# Patient Record
Sex: Male | Born: 1955 | Race: White | Hispanic: No | Marital: Married | Smoking: Never smoker
Health system: Southern US, Community
[De-identification: ages and names within clinical notes are randomized; demographics above are authoritative.]

## PROBLEM LIST (undated history)

## (undated) DIAGNOSIS — K219 Gastro-esophageal reflux disease without esophagitis: Secondary | ICD-10-CM

## (undated) DIAGNOSIS — J45909 Unspecified asthma, uncomplicated: Secondary | ICD-10-CM

## (undated) DIAGNOSIS — T7840XA Allergy, unspecified, initial encounter: Secondary | ICD-10-CM

## (undated) HISTORY — DX: Gastro-esophageal reflux disease without esophagitis: K21.9

## (undated) HISTORY — DX: Allergy, unspecified, initial encounter: T78.40XA

## (undated) HISTORY — DX: Unspecified asthma, uncomplicated: J45.909

---
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
## 1989-01-20 HISTORY — PX: CHOLECYSTECTOMY: SHX55

## 2000-03-05 ENCOUNTER — Encounter: Payer: Self-pay | Admitting: *Deleted

## 2000-03-05 ENCOUNTER — Emergency Department (HOSPITAL_COMMUNITY): Admission: EM | Admit: 2000-03-05 | Discharge: 2000-03-05 | Payer: Self-pay | Admitting: *Deleted

## 2000-03-06 ENCOUNTER — Emergency Department (HOSPITAL_COMMUNITY): Admission: EM | Admit: 2000-03-06 | Discharge: 2000-03-06 | Payer: Self-pay | Admitting: *Deleted

## 2000-03-16 ENCOUNTER — Encounter: Payer: Self-pay | Admitting: Orthopedic Surgery

## 2000-03-17 ENCOUNTER — Encounter: Payer: Self-pay | Admitting: Orthopedic Surgery

## 2000-03-17 ENCOUNTER — Ambulatory Visit (HOSPITAL_COMMUNITY): Admission: RE | Admit: 2000-03-17 | Discharge: 2000-03-18 | Payer: Self-pay | Admitting: Orthopedic Surgery

## 2000-05-24 ENCOUNTER — Encounter: Payer: Self-pay | Admitting: Orthopedic Surgery

## 2000-05-24 ENCOUNTER — Ambulatory Visit (HOSPITAL_COMMUNITY): Admission: RE | Admit: 2000-05-24 | Discharge: 2000-05-25 | Payer: Self-pay | Admitting: Orthopedic Surgery

## 2000-08-17 ENCOUNTER — Encounter: Payer: Self-pay | Admitting: Orthopedic Surgery

## 2000-08-17 ENCOUNTER — Ambulatory Visit (HOSPITAL_COMMUNITY): Admission: RE | Admit: 2000-08-17 | Discharge: 2000-08-17 | Payer: Self-pay | Admitting: Orthopedic Surgery

## 2001-04-05 ENCOUNTER — Ambulatory Visit (HOSPITAL_COMMUNITY): Admission: RE | Admit: 2001-04-05 | Discharge: 2001-04-05 | Payer: Self-pay | Admitting: Orthopedic Surgery

## 2001-04-05 ENCOUNTER — Encounter: Payer: Self-pay | Admitting: Orthopedic Surgery

## 2001-12-20 HISTORY — PX: OTHER SURGICAL HISTORY: SHX169

## 2004-10-06 ENCOUNTER — Encounter: Admission: RE | Admit: 2004-10-06 | Discharge: 2004-10-06 | Payer: Self-pay | Admitting: Neurology

## 2004-10-12 ENCOUNTER — Encounter: Admission: RE | Admit: 2004-10-12 | Discharge: 2004-10-12 | Payer: Self-pay | Admitting: Neurology

## 2004-12-24 ENCOUNTER — Ambulatory Visit: Payer: Self-pay | Admitting: Pulmonary Disease

## 2005-07-14 ENCOUNTER — Ambulatory Visit: Payer: Self-pay | Admitting: Pulmonary Disease

## 2007-07-26 ENCOUNTER — Ambulatory Visit: Payer: Self-pay | Admitting: Pulmonary Disease

## 2007-07-26 LAB — CONVERTED CEMR LAB
ALT: 17 units/L (ref 0–53)
AST: 19 units/L (ref 0–37)
Albumin: 4.3 g/dL (ref 3.5–5.2)
Alkaline Phosphatase: 73 units/L (ref 39–117)
BUN: 14 mg/dL (ref 6–23)
Basophils Absolute: 0 10*3/uL (ref 0.0–0.1)
Basophils Relative: 0.5 % (ref 0.0–1.0)
Bilirubin, Direct: 0.1 mg/dL (ref 0.0–0.3)
CO2: 29 meq/L (ref 19–32)
Calcium: 9.3 mg/dL (ref 8.4–10.5)
Chloride: 105 meq/L (ref 96–112)
Cholesterol: 209 mg/dL (ref 0–200)
Creatinine, Ser: 0.8 mg/dL (ref 0.4–1.5)
Direct LDL: 133.8 mg/dL
Eosinophils Absolute: 0.2 10*3/uL (ref 0.0–0.6)
Eosinophils Relative: 3.4 % (ref 0.0–5.0)
GFR calc Af Amer: 132 mL/min
GFR calc non Af Amer: 109 mL/min
Glucose, Bld: 106 mg/dL — ABNORMAL HIGH (ref 70–99)
HCT: 44.4 % (ref 39.0–52.0)
HDL: 54.2 mg/dL (ref >39.0)
Hemoglobin: 15.5 g/dL (ref 13.0–17.0)
Lymphocytes Relative: 22.3 % (ref 12.0–46.0)
MCHC: 35 g/dL (ref 30.0–36.0)
MCV: 92.4 fL (ref 78.0–100.0)
Monocytes Absolute: 0.4 10*3/uL (ref 0.2–0.7)
Monocytes Relative: 9.1 % (ref 3.0–11.0)
Neutro Abs: 3.1 10*3/uL (ref 1.4–7.7)
Neutrophils Relative %: 64.7 % (ref 43.0–77.0)
PSA: 0.93 ng/mL (ref 0.10–4.00)
Platelets: 196 10*3/uL (ref 150–400)
Potassium: 4.4 meq/L (ref 3.5–5.1)
RBC: 4.8 M/uL (ref 4.22–5.81)
RDW: 11.6 % (ref 11.5–14.6)
Sodium: 139 meq/L (ref 135–145)
TSH: 2.08 microintl units/mL (ref 0.35–5.50)
Total Bilirubin: 2.1 mg/dL — ABNORMAL HIGH (ref 0.3–1.2)
Total CHOL/HDL Ratio: 3.9
Total Protein: 7.1 g/dL (ref 6.0–8.3)
Triglycerides: 47 mg/dL (ref 0–149)
VLDL: 9 mg/dL (ref 0–40)
WBC: 4.7 10*3/uL (ref 4.5–10.5)

## 2008-02-17 ENCOUNTER — Emergency Department (HOSPITAL_COMMUNITY): Admission: EM | Admit: 2008-02-17 | Discharge: 2008-02-17 | Payer: Self-pay | Admitting: Family Medicine

## 2008-11-15 ENCOUNTER — Emergency Department (HOSPITAL_COMMUNITY): Admission: EM | Admit: 2008-11-15 | Discharge: 2008-11-15 | Payer: Self-pay | Admitting: Emergency Medicine

## 2008-12-19 ENCOUNTER — Ambulatory Visit: Payer: Self-pay | Admitting: Pulmonary Disease

## 2008-12-19 DIAGNOSIS — J45909 Unspecified asthma, uncomplicated: Secondary | ICD-10-CM

## 2008-12-19 DIAGNOSIS — Z9189 Other specified personal risk factors, not elsewhere classified: Secondary | ICD-10-CM | POA: Insufficient documentation

## 2008-12-19 DIAGNOSIS — E7800 Pure hypercholesterolemia, unspecified: Secondary | ICD-10-CM | POA: Insufficient documentation

## 2008-12-19 DIAGNOSIS — Z87438 Personal history of other diseases of male genital organs: Secondary | ICD-10-CM

## 2008-12-19 DIAGNOSIS — R209 Unspecified disturbances of skin sensation: Secondary | ICD-10-CM | POA: Insufficient documentation

## 2008-12-19 DIAGNOSIS — J309 Allergic rhinitis, unspecified: Secondary | ICD-10-CM

## 2008-12-19 LAB — CONVERTED CEMR LAB
ALT: 17 units/L (ref 0–53)
AST: 19 units/L (ref 0–37)
Albumin: 4.3 g/dL (ref 3.5–5.2)
Alkaline Phosphatase: 72 units/L (ref 39–117)
BUN: 20 mg/dL (ref 6–23)
Bacteria, UA: NEGATIVE
Basophils Absolute: 0 10*3/uL (ref 0.0–0.1)
Basophils Relative: 0.8 % (ref 0.0–3.0)
Bilirubin Urine: NEGATIVE
Bilirubin, Direct: 0.2 mg/dL (ref 0.0–0.3)
CO2: 31 meq/L (ref 19–32)
Calcium: 9.6 mg/dL (ref 8.4–10.5)
Chloride: 105 meq/L (ref 96–112)
Cholesterol: 207 mg/dL (ref 0–200)
Creatinine, Ser: 0.8 mg/dL (ref 0.4–1.5)
Crystals: NEGATIVE
Direct LDL: 128 mg/dL
Eosinophils Absolute: 0.1 10*3/uL (ref 0.0–0.7)
Eosinophils Relative: 3.1 % (ref 0.0–5.0)
GFR calc Af Amer: 131 mL/min
GFR calc non Af Amer: 108 mL/min
Glucose, Bld: 100 mg/dL — ABNORMAL HIGH (ref 70–99)
HCT: 44.7 % (ref 39.0–52.0)
HDL: 60.3 mg/dL (ref >39.0)
Hemoglobin, Urine: NEGATIVE
Hemoglobin: 16.1 g/dL (ref 13.0–17.0)
Ketones, ur: NEGATIVE mg/dL
Leukocytes, UA: NEGATIVE
Lymphocytes Relative: 24.4 % (ref 12.0–46.0)
MCHC: 36.1 g/dL — ABNORMAL HIGH (ref 30.0–36.0)
MCV: 91 fL (ref 78.0–100.0)
Monocytes Absolute: 0.4 10*3/uL (ref 0.1–1.0)
Monocytes Relative: 8.6 % (ref 3.0–12.0)
Neutro Abs: 2.9 10*3/uL (ref 1.4–7.7)
Neutrophils Relative %: 63.1 % (ref 43.0–77.0)
Nitrite: NEGATIVE
PSA: 1.09 ng/mL (ref 0.10–4.00)
Platelets: 171 10*3/uL (ref 150–400)
Potassium: 4.7 meq/L (ref 3.5–5.1)
RBC / HPF: NONE SEEN
RBC: 4.91 M/uL (ref 4.22–5.81)
RDW: 11.7 % (ref 11.5–14.6)
Sodium: 142 meq/L (ref 135–145)
Specific Gravity, Urine: 1.02 (ref 1.000–1.03)
TSH: 1.58 microintl units/mL (ref 0.35–5.50)
Total Bilirubin: 1.4 mg/dL — ABNORMAL HIGH (ref 0.3–1.2)
Total CHOL/HDL Ratio: 3.4
Total Protein, Urine: NEGATIVE mg/dL
Total Protein: 7 g/dL (ref 6.0–8.3)
Triglycerides: 46 mg/dL (ref 0–149)
Urine Glucose: NEGATIVE mg/dL
Urobilinogen, UA: 0.2 (ref 0.0–1.0)
VLDL: 9 mg/dL (ref 0–40)
WBC, UA: NONE SEEN cells/hpf
WBC: 4.5 10*3/uL (ref 4.5–10.5)
pH: 6 (ref 5.0–8.0)

## 2008-12-31 ENCOUNTER — Ambulatory Visit: Payer: Self-pay | Admitting: Gastroenterology

## 2009-01-15 ENCOUNTER — Ambulatory Visit: Payer: Self-pay | Admitting: Gastroenterology

## 2010-05-22 ENCOUNTER — Telehealth: Payer: Self-pay | Admitting: Pulmonary Disease

## 2010-05-27 ENCOUNTER — Ambulatory Visit: Payer: Self-pay | Admitting: Pulmonary Disease

## 2010-05-29 LAB — CONVERTED CEMR LAB
ALT: 17 units/L (ref 0–53)
AST: 19 units/L (ref 0–37)
Albumin: 4.5 g/dL (ref 3.5–5.2)
Alkaline Phosphatase: 72 units/L (ref 39–117)
Anti Nuclear Antibody(ANA): NEGATIVE
BUN: 20 mg/dL (ref 6–23)
Basophils Absolute: 0 10*3/uL (ref 0.0–0.1)
Basophils Relative: 0.1 % (ref 0.0–3.0)
Bilirubin, Direct: 0.2 mg/dL (ref 0.0–0.3)
CO2: 31 meq/L (ref 19–32)
CRP, High Sensitivity: 0.86 (ref 0.00–5.00)
Calcium: 9.1 mg/dL (ref 8.4–10.5)
Chloride: 104 meq/L (ref 96–112)
Creatinine, Ser: 0.7 mg/dL (ref 0.4–1.5)
Eosinophils Absolute: 0.2 10*3/uL (ref 0.0–0.7)
Eosinophils Relative: 2.9 % (ref 0.0–5.0)
GFR calc non Af Amer: 129.87 mL/min (ref >60)
Glucose, Bld: 85 mg/dL (ref 70–99)
HCT: 43.5 % (ref 39.0–52.0)
Hemoglobin: 15.6 g/dL (ref 13.0–17.0)
Lymphocytes Relative: 21.1 % (ref 12.0–46.0)
Lymphs Abs: 1.4 10*3/uL (ref 0.7–4.0)
MCHC: 35.8 g/dL (ref 30.0–36.0)
MCV: 92.9 fL (ref 78.0–100.0)
Monocytes Absolute: 0.5 10*3/uL (ref 0.1–1.0)
Monocytes Relative: 7.5 % (ref 3.0–12.0)
Neutro Abs: 4.7 10*3/uL (ref 1.4–7.7)
Neutrophils Relative %: 68.4 % (ref 43.0–77.0)
Platelets: 180 10*3/uL (ref 150.0–400.0)
Potassium: 4 meq/L (ref 3.5–5.1)
RBC: 4.68 M/uL (ref 4.22–5.81)
RDW: 12.8 % (ref 11.5–14.6)
Rheumatoid fact SerPl-aCnc: 32.6 intl units/mL — ABNORMAL HIGH (ref 0.0–20.0)
Sed Rate: 8 mm/hr (ref 0–22)
Sodium: 142 meq/L (ref 135–145)
TSH: 1.14 microintl units/mL (ref 0.35–5.50)
Total Bilirubin: 1.6 mg/dL — ABNORMAL HIGH (ref 0.3–1.2)
Total Protein: 6.9 g/dL (ref 6.0–8.3)
WBC: 6.8 10*3/uL (ref 4.5–10.5)

## 2011-01-19 NOTE — Progress Notes (Signed)
Summary: swelling  Phone Note Call from Patient Call back at Home Phone [phone] Call back at Work Phone [phone] Call back at [phone] office or cell   Caller: Patient Call For: Kyzer Blowe Reason for Call: Talk to Nurse Summary of Call: pt having trouble with hands and feet swelling( retaining water badly). x1 week.  Migraines are increasing.  Almost out of meds for his migraines.  Would like to see SN.  Hasn't seen him since 12/09.  Can we get him worked in?  Call office or cell 1st. Initial call taken by: Eugene Gavia,  May 22, 2010 1:25 PM  Follow-up for Phone Call        Spoke with pt.  He c/o swelling in hands and ankles and feet x 1 wk.  He also states that he has been having migraines alot more frequesntly over the past several months.  Almost out of midrin.  Would like ov with SN.  Last seen Dec 2009.  Please advise, thanks Follow-up by: Vernie Murders,  May 22, 2010 2:28 PM  Additional Follow-up for Phone Call Additional follow up Details #1::        per SN---ok to come in on wednesday at 3:30---he has enough midrin to last until the appt---he has been watching his salt intake but will cont to do so -- i told him that if he needs [place] prior to the appt to give [place] a call.  pt voiced his understanding and is aware of appt date. Randell Loop CMA  May 22, 2010 2:55 PM
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
## 2011-05-07 NOTE — Op Note (Signed)
[place]  Patient:    Austin Hubbard, Austin Hubbard                       MRN: [id number] Proc. Date: 05/24/00 Adm. Date:  [id number] Disc. Date: [id number] Attending:  Burnard Bunting                           Operative Report  PREOPERATIVE DIAGNOSIS:  Retained hardware right clavicle following open reduction/internal fixation.  POSTOPERATIVE DIAGNOSIS:  Delayed union of right clavicle fracture.  PROCEDURE:  Right clavicle attempted removal of right clavicle pin with identification of delayed union and takedown of fibrous tissue from within the clavicular fracture site.  Bone grafting and replacement of clavicular pin.  SURGEON:  Graylin Shiver. August Saucer, M.D.  ANESTHESIA:  General endotracheal.  ESTIMATED BLOOD LOSS:  10 cc.  INDICATIONS:  Austin Hubbard is a 55 year old patient, who is now 9-1/2 weeks status post open reduction/internal fixation of displaced right clavicle shaft fracture with subsequent mild brachial plexopathy.  The patient achieved full painless range of motion of his shoulder and had ______ formation evident on plain x-rays prior to todays hardware removal.  PROCEDURE IN DETAIL:  Patient was brought to the operating room, where general endotracheal anesthesia was induced.  Preoperative IV antibiotics were administered.  Right arm and shoulder girdle were prepped with Betadine and Duraprep solution and draped in a sterile manner.  Ioban sheet was used to cover the operative field.  Prior incision for prior posterior incision for screw placement was incised.  Bursa had developed around the pin site.  This bursal fluid and sac were evacuated.  The T-handle wrench was then placed on the clavicular screw, which was then backed out to the fracture site.  Under fluoroscopic guidance, the fracture was noted to have hinged motion.  Across the distal end was noted to subluxate about 2-3 mm posteriorly after removal of the pin from across the fracture  site.  Using the pin as a lever, motion at the fracture site was again demonstrated.  At this time, an decision was made to proceed with takedown of the delayed union and bone grafting.  Prior incision over the mid portion of the clavicle was utilized.  Skin and subcutaneous tissue were sharply divided.  The fracture site was identified. Under fluoroscopic and direct visualization, the pin was then advanced back into the medial fragment.  Using a curet and Baer rongeurs, fibrous tissue was removed from the bony edges.  A curet was used to remove sclerotic bone from around the edges.  At this time, the incision was thoroughly irrigated.  After reestablishing with minimal soft tissue dissection except around the fracture site and debridement of any tissue between the bone ends and debridement of the sclerotic portions of the bone ends back to bleeding bone, a combination of Grafton and pulverized bone chips were mixed and then placed into the delayed/nonunion site.  The screw was then advanced until the hub of the nut assembly on the posterolateral clavicle was well seated against the lateral clavicle.  Good purchase with the distal screw threads were obtained in the medial fragment.  The clavicle moved as a unit when checked under fluoroscopy. At this time, the fibrous tissue from the bony nonunion site was sent for culture and sensitivity.  There was no evidence of infection at either of the incisions.  Both incisions were then closed using 2-0 Vicryl  to reapproximate the deep subdermal tissue and 3-0 nylon to reapproximate the skin edges.  An impervious dressing was placed over the incisions.  Patient was then placed in a shoulder sling and transferred to the recovery room in stable condition. DD:  05/25/00 TD:  05/27/00 Job: [id number]

## 2011-09-13 LAB — POCT RAPID STREP A: Streptococcus, Group A Screen (Direct): NEGATIVE

## 2011-09-21 LAB — POCT RAPID STREP A: Streptococcus, Group A Screen (Direct): NEGATIVE

## 2014-01-25 ENCOUNTER — Telehealth: Payer: Self-pay | Admitting: Pulmonary Disease

## 2014-01-25 NOTE — Telephone Encounter (Signed)
Called and spoke with pt and he is aware that SN will be retiring from primary care in April.  Pt was given [facility] in [place] or [facility] [place].  Pt is aware of these offices and will call and set up his appt.  Nothing further is needed.

## 2014-01-25 NOTE — Telephone Encounter (Signed)
Pt want recommendation for new primary dr  from  dr/ nadel.Austin GriffinsStanley A Hubbard

## 2014-11-25 ENCOUNTER — Telehealth: Payer: Self-pay | Admitting: Pulmonary Disease

## 2014-11-25 NOTE — Telephone Encounter (Signed)
Pt calling a/b appointment again.Caren GriffinsStanley A Dalton

## 2014-11-25 NOTE — Telephone Encounter (Signed)
Called and spoke to pt. Pt requesting appt with SN for PCP. Pt has not been seen since 2011. Advised pt that SN is no longer taking new PCP pt's . Pt requested Dr. Kriste BasqueNadel recommend a new PCP he should see.   Dr. Kriste BasqueNadel please advise.

## 2014-11-25 NOTE — Telephone Encounter (Signed)
Per SN---  He will be happy to see this pt.  appt has been scheduled for the pt on Wednesday.  Pt is aware and nothing further is needed.

## 2014-11-27 ENCOUNTER — Ambulatory Visit (INDEPENDENT_AMBULATORY_CARE_PROVIDER_SITE_OTHER): Payer: BC Managed Care – PPO | Admitting: Pulmonary Disease

## 2014-11-27 ENCOUNTER — Encounter: Payer: Self-pay | Admitting: Pulmonary Disease

## 2014-11-27 ENCOUNTER — Other Ambulatory Visit (INDEPENDENT_AMBULATORY_CARE_PROVIDER_SITE_OTHER): Payer: BC Managed Care – PPO

## 2014-11-27 ENCOUNTER — Ambulatory Visit (INDEPENDENT_AMBULATORY_CARE_PROVIDER_SITE_OTHER)
Admission: RE | Admit: 2014-11-27 | Discharge: 2014-11-27 | Disposition: A | Discharge status: Home / Self Care | Payer: BC Managed Care – PPO | Source: Ambulatory Visit | Attending: Pulmonary Disease | Admitting: Pulmonary Disease

## 2014-11-27 ENCOUNTER — Encounter (INDEPENDENT_AMBULATORY_CARE_PROVIDER_SITE_OTHER): Payer: Self-pay

## 2014-11-27 VITALS — BP 138/86 | HR 66 | Temp 98.0°F | Ht 67.0 in | Wt 169.2 lb

## 2014-11-27 DIAGNOSIS — Z0000 Encounter for general adult medical examination without abnormal findings: Secondary | ICD-10-CM | POA: Insufficient documentation

## 2014-11-27 DIAGNOSIS — E7800 Pure hypercholesterolemia, unspecified: Secondary | ICD-10-CM

## 2014-11-27 DIAGNOSIS — Z8669 Personal history of other diseases of the nervous system and sense organs: Secondary | ICD-10-CM

## 2014-11-27 DIAGNOSIS — J301 Allergic rhinitis due to pollen: Secondary | ICD-10-CM

## 2014-11-27 DIAGNOSIS — J4520 Mild intermittent asthma, uncomplicated: Secondary | ICD-10-CM

## 2014-11-27 DIAGNOSIS — Z87438 Personal history of other diseases of male genital organs: Secondary | ICD-10-CM

## 2014-11-27 DIAGNOSIS — Z820 Family history of epilepsy and other diseases of the nervous system: Secondary | ICD-10-CM

## 2014-11-27 DIAGNOSIS — R1310 Dysphagia, unspecified: Secondary | ICD-10-CM

## 2014-11-27 LAB — CBC WITH DIFFERENTIAL/PLATELET
Basophils Absolute: 0 10*3/uL (ref 0.0–0.1)
Basophils Relative: 0.5 % (ref 0.0–3.0)
Eosinophils Absolute: 0.1 10*3/uL (ref 0.0–0.7)
Eosinophils Relative: 1.8 % (ref 0.0–5.0)
HCT: 47.3 % (ref 39.0–52.0)
Hemoglobin: 16.3 g/dL (ref 13.0–17.0)
Lymphocytes Relative: 21.5 % (ref 12.0–46.0)
Lymphs Abs: 1.3 10*3/uL (ref 0.7–4.0)
MCHC: 34.4 g/dL (ref 30.0–36.0)
MCV: 92.5 fl (ref 78.0–100.0)
Monocytes Absolute: 0.5 10*3/uL (ref 0.1–1.0)
Monocytes Relative: 7.8 % (ref 3.0–12.0)
Neutro Abs: 4.2 10*3/uL (ref 1.4–7.7)
Neutrophils Relative %: 68.4 % (ref 43.0–77.0)
Platelets: 201 10*3/uL (ref 150.0–400.0)
RBC: 5.11 Mil/uL (ref 4.22–5.81)
RDW: 12.5 % (ref 11.5–15.5)
WBC: 6.1 10*3/uL (ref 4.0–10.5)

## 2014-11-27 LAB — LIPID PANEL
Cholesterol: 211 mg/dL — ABNORMAL HIGH (ref 0–200)
HDL: 53.7 mg/dL (ref >39.00)
LDL Cholesterol: 141 mg/dL — ABNORMAL HIGH (ref 0–99)
NonHDL: 157.3
Total CHOL/HDL Ratio: 4
Triglycerides: 82 mg/dL (ref 0.0–149.0)
VLDL: 16.4 mg/dL (ref 0.0–40.0)

## 2014-11-27 LAB — BASIC METABOLIC PANEL
BUN: 13 mg/dL (ref 6–23)
CO2: 28 mEq/L (ref 19–32)
Calcium: 9.2 mg/dL (ref 8.4–10.5)
Chloride: 102 mEq/L (ref 96–112)
Creatinine, Ser: 0.8 mg/dL (ref 0.4–1.5)
GFR: 112.33 mL/min (ref >60.00)
Glucose, Bld: 93 mg/dL (ref 70–99)
Potassium: 4.3 mEq/L (ref 3.5–5.1)
Sodium: 138 mEq/L (ref 135–145)

## 2014-11-27 LAB — HEPATIC FUNCTION PANEL
ALT: 12 U/L (ref 0–53)
AST: 18 U/L (ref 0–37)
Albumin: 4.4 g/dL (ref 3.5–5.2)
Alkaline Phosphatase: 73 U/L (ref 39–117)
Bilirubin, Direct: 0.2 mg/dL (ref 0.0–0.3)
Total Bilirubin: 1.8 mg/dL — ABNORMAL HIGH (ref 0.2–1.2)
Total Protein: 7.1 g/dL (ref 6.0–8.3)

## 2014-11-27 LAB — PSA: PSA: 1.06 ng/mL (ref 0.10–4.00)

## 2014-11-27 LAB — TSH: TSH: 2.42 u[IU]/mL (ref 0.35–4.50)

## 2014-11-27 MED ORDER — PANTOPRAZOLE SODIUM 40 MG PO TBEC: 40.0000 mg | DELAYED_RELEASE_TABLET | Freq: Every day | ORAL | Status: DC

## 2014-11-27 MED ORDER — ISOMETHEPTENE-APAP-DICHLORAL 65-325-100 MG PO CAPS: 1.0000 | ORAL_CAPSULE | Freq: Four times a day (QID) | ORAL | Status: DC | PRN

## 2014-11-27 NOTE — Patient Instructions (Signed)
Today we updated your med list in our EPIC system...     We wrote new prescriptions for>    MIDRIN to use as needed for headaches...    PROTONIX (Pantoprazole) 40mg - take one tab about 30 min before the evening meal...  Today we did your follow up CXR, EKG, & fasting blood work...    We will contact you w/ the results when available...   wewill sched a Barium Swallow for further eval of your swallowing difficulty...    We will contact you w/ these results when available...   Keep up the good work w/ diet & exercise...  Call for any questions...  Let's plan a follow up visit in [id number], sooner if needed for problems.Marland Kitchen..Marland Kitchen
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
## 2014-11-28 ENCOUNTER — Ambulatory Visit (HOSPITAL_COMMUNITY)
Admission: RE | Admit: 2014-11-28 | Discharge: 2014-11-28 | Disposition: A | Discharge status: Home / Self Care | Payer: BC Managed Care – PPO | Source: Ambulatory Visit | Attending: Pulmonary Disease | Admitting: Pulmonary Disease

## 2014-11-28 DIAGNOSIS — R1310 Dysphagia, unspecified: Secondary | ICD-10-CM | POA: Insufficient documentation

## 2014-11-29 ENCOUNTER — Telehealth: Payer: Self-pay | Admitting: Pulmonary Disease

## 2014-11-29 ENCOUNTER — Ambulatory Visit (INDEPENDENT_AMBULATORY_CARE_PROVIDER_SITE_OTHER): Payer: BC Managed Care – PPO

## 2014-11-29 ENCOUNTER — Other Ambulatory Visit: Payer: Self-pay | Admitting: Pulmonary Disease

## 2014-11-29 ENCOUNTER — Ambulatory Visit: Payer: BC Managed Care – PPO

## 2014-11-29 DIAGNOSIS — Z23 Encounter for immunization: Secondary | ICD-10-CM

## 2014-11-29 DIAGNOSIS — R1310 Dysphagia, unspecified: Secondary | ICD-10-CM

## 2014-11-29 NOTE — Telephone Encounter (Signed)
Shots are taken care of but pt still needs to be scheduled for the other test [phone] or [phone]

## 2014-12-02 ENCOUNTER — Other Ambulatory Visit (HOSPITAL_COMMUNITY): Payer: Self-pay | Admitting: Pulmonary Disease

## 2014-12-02 DIAGNOSIS — R1314 Dysphagia, pharyngoesophageal phase: Secondary | ICD-10-CM

## 2014-12-02 NOTE — Telephone Encounter (Signed)
According to eoic this is set for 12-17-14, is the pt aware? [id number]. Carron CurieJennifer Tyia Binford, CMA

## 2014-12-03 NOTE — Telephone Encounter (Signed)
LMTC x 1 for pt at [phone]

## 2014-12-03 NOTE — Telephone Encounter (Signed)
Spoke with pt and he wants to know that since the first part of his swallowing test was normal does dr Kriste BasqueNadel still want him to start on the Protonix.  Next part of test is on 12/17/14.  Please advise.

## 2014-12-03 NOTE — Telephone Encounter (Signed)
SN has already called and spoke with the pt and pt was advised that he will need to stay on the protonix to decrease the acid related symptoms that could account for the throat/swallowing problem.  But we still need to pursue this further with speech path and poss GI appt pending the results.

## 2014-12-03 NOTE — Telephone Encounter (Signed)
Patient has questions about results of swallow test and medication. [phone]

## 2014-12-17 ENCOUNTER — Ambulatory Visit (HOSPITAL_COMMUNITY)
Admission: RE | Admit: 2014-12-17 | Discharge: 2014-12-17 | Disposition: A | Discharge status: Home / Self Care | Payer: BC Managed Care – PPO | Source: Ambulatory Visit | Attending: Pulmonary Disease | Admitting: Pulmonary Disease

## 2014-12-17 DIAGNOSIS — R1310 Dysphagia, unspecified: Secondary | ICD-10-CM | POA: Diagnosis present

## 2014-12-17 DIAGNOSIS — N419 Inflammatory disease of prostate, unspecified: Secondary | ICD-10-CM | POA: Insufficient documentation

## 2014-12-17 DIAGNOSIS — J45909 Unspecified asthma, uncomplicated: Secondary | ICD-10-CM | POA: Diagnosis not present

## 2014-12-17 DIAGNOSIS — G43909 Migraine, unspecified, not intractable, without status migrainosus: Secondary | ICD-10-CM | POA: Insufficient documentation

## 2014-12-17 DIAGNOSIS — R0789 Other chest pain: Secondary | ICD-10-CM | POA: Insufficient documentation

## 2014-12-17 DIAGNOSIS — E7211 Homocystinuria: Secondary | ICD-10-CM | POA: Diagnosis not present

## 2014-12-17 DIAGNOSIS — R1313 Dysphagia, pharyngeal phase: Secondary | ICD-10-CM | POA: Insufficient documentation

## 2014-12-17 DIAGNOSIS — R1314 Dysphagia, pharyngoesophageal phase: Secondary | ICD-10-CM

## 2014-12-17 DIAGNOSIS — E780 Pure hypercholesterolemia: Secondary | ICD-10-CM | POA: Diagnosis not present
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
## 2015-05-27 ENCOUNTER — Encounter: Payer: Self-pay | Admitting: Gastroenterology

## 2015-06-06 ENCOUNTER — Encounter: Payer: Self-pay | Admitting: Pulmonary Disease

## 2015-06-06 ENCOUNTER — Ambulatory Visit (INDEPENDENT_AMBULATORY_CARE_PROVIDER_SITE_OTHER): Payer: BC Managed Care – PPO | Admitting: Pulmonary Disease

## 2015-06-06 VITALS — BP 130/74 | HR 66 | Temp 98.4°F | Wt 173.8 lb

## 2015-06-06 DIAGNOSIS — E7800 Pure hypercholesterolemia, unspecified: Secondary | ICD-10-CM

## 2015-06-06 DIAGNOSIS — R1310 Dysphagia, unspecified: Secondary | ICD-10-CM | POA: Insufficient documentation

## 2015-06-06 DIAGNOSIS — Z8669 Personal history of other diseases of the nervous system and sense organs: Secondary | ICD-10-CM

## 2015-06-06 DIAGNOSIS — J301 Allergic rhinitis due to pollen: Secondary | ICD-10-CM | POA: Diagnosis not present

## 2015-06-06 DIAGNOSIS — J4520 Mild intermittent asthma, uncomplicated: Secondary | ICD-10-CM
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
## 2015-06-06 NOTE — Patient Instructions (Signed)
Today we updated your med list in our EPIC system...    Continue your current medications the same...  Continue the exercises and precautions as outlined by the speech pathologists...  Call for any questions...  Let's plan a follow up visit in 55mo, sooner if needed for problems.Marland KitchenMarland Kitchen

## 2015-11-28 ENCOUNTER — Ambulatory Visit: Payer: BC Managed Care – PPO | Admitting: Pulmonary Disease

## 2015-12-11 ENCOUNTER — Ambulatory Visit (INDEPENDENT_AMBULATORY_CARE_PROVIDER_SITE_OTHER)
Admission: RE | Admit: 2015-12-11 | Discharge: 2015-12-11 | Disposition: A | Discharge status: Home / Self Care | Payer: BC Managed Care – PPO | Source: Ambulatory Visit | Attending: Pulmonary Disease | Admitting: Pulmonary Disease

## 2015-12-11 ENCOUNTER — Encounter: Payer: Self-pay | Admitting: Pulmonary Disease

## 2015-12-11 ENCOUNTER — Other Ambulatory Visit (INDEPENDENT_AMBULATORY_CARE_PROVIDER_SITE_OTHER): Payer: BC Managed Care – PPO

## 2015-12-11 ENCOUNTER — Ambulatory Visit (INDEPENDENT_AMBULATORY_CARE_PROVIDER_SITE_OTHER): Payer: BC Managed Care – PPO | Admitting: Pulmonary Disease

## 2015-12-11 VITALS — BP 150/90 | HR 75 | Temp 98.4°F | Ht 67.0 in | Wt 170.6 lb

## 2015-12-11 DIAGNOSIS — Z87438 Personal history of other diseases of male genital organs: Secondary | ICD-10-CM | POA: Diagnosis not present

## 2015-12-11 DIAGNOSIS — R1310 Dysphagia, unspecified: Secondary | ICD-10-CM | POA: Diagnosis not present

## 2015-12-11 DIAGNOSIS — Z0000 Encounter for general adult medical examination without abnormal findings: Secondary | ICD-10-CM

## 2015-12-11 DIAGNOSIS — E7800 Pure hypercholesterolemia, unspecified: Secondary | ICD-10-CM | POA: Diagnosis not present

## 2015-12-11 DIAGNOSIS — Z23 Encounter for immunization: Secondary | ICD-10-CM | POA: Diagnosis not present

## 2015-12-11 DIAGNOSIS — J301 Allergic rhinitis due to pollen: Secondary | ICD-10-CM | POA: Diagnosis not present

## 2015-12-11 DIAGNOSIS — Z8669 Personal history of other diseases of the nervous system and sense organs: Secondary | ICD-10-CM

## 2015-12-11 LAB — SEDIMENTATION RATE: Sed Rate: 12 mm/hr (ref 0–22)

## 2015-12-11 LAB — BASIC METABOLIC PANEL
BUN: 17 mg/dL (ref 6–23)
CALCIUM: 9.7 mg/dL (ref 8.4–10.5)
CHLORIDE: 104 meq/L (ref 96–112)
CO2: 32 meq/L (ref 19–32)
Creatinine, Ser: 0.97 mg/dL (ref 0.40–1.50)
GFR: 84.46 mL/min (ref >60.00)
Glucose, Bld: 102 mg/dL — ABNORMAL HIGH (ref 70–99)
POTASSIUM: 4.5 meq/L (ref 3.5–5.1)
SODIUM: 141 meq/L (ref 135–145)

## 2015-12-11 LAB — PSA: PSA: 1.22 ng/mL (ref 0.10–4.00)

## 2015-12-11 LAB — LIPID PANEL
Cholesterol: 211 mg/dL — ABNORMAL HIGH (ref 0–200)
HDL: 52.7 mg/dL (ref >39.00)
LDL Cholesterol: 149 mg/dL — ABNORMAL HIGH (ref 0–99)
NONHDL: 158.24
Total CHOL/HDL Ratio: 4
Triglycerides: 44 mg/dL (ref 0.0–149.0)
VLDL: 8.8 mg/dL (ref 0.0–40.0)

## 2015-12-11 LAB — CBC WITH DIFFERENTIAL/PLATELET
Basophils Absolute: 0 10*3/uL (ref 0.0–0.1)
Basophils Relative: 0.4 % (ref 0.0–3.0)
EOS PCT: 2.4 % (ref 0.0–5.0)
Eosinophils Absolute: 0.1 10*3/uL (ref 0.0–0.7)
HCT: 46.3 % (ref 39.0–52.0)
Hemoglobin: 15.7 g/dL (ref 13.0–17.0)
LYMPHS ABS: 1.3 10*3/uL (ref 0.7–4.0)
Lymphocytes Relative: 22.6 % (ref 12.0–46.0)
MCHC: 34 g/dL (ref 30.0–36.0)
MCV: 92.2 fl (ref 78.0–100.0)
MONOS PCT: 7.3 % (ref 3.0–12.0)
Monocytes Absolute: 0.4 10*3/uL (ref 0.1–1.0)
NEUTROS ABS: 3.9 10*3/uL (ref 1.4–7.7)
NEUTROS PCT: 67.3 % (ref 43.0–77.0)
Platelets: 205 10*3/uL (ref 150.0–400.0)
RBC: 5.02 Mil/uL (ref 4.22–5.81)
RDW: 12.5 % (ref 11.5–15.5)
WBC: 5.8 10*3/uL (ref 4.0–10.5)

## 2015-12-11 LAB — HEPATIC FUNCTION PANEL
ALK PHOS: 81 U/L (ref 39–117)
ALT: 14 U/L (ref 0–53)
AST: 17 U/L (ref 0–37)
Albumin: 4.5 g/dL (ref 3.5–5.2)
BILIRUBIN DIRECT: 0.2 mg/dL (ref 0.0–0.3)
TOTAL PROTEIN: 7 g/dL (ref 6.0–8.3)
Total Bilirubin: 1.5 mg/dL — ABNORMAL HIGH (ref 0.2–1.2)

## 2015-12-11 LAB — TSH: TSH: 2.43 u[IU]/mL (ref 0.35–4.50)

## 2015-12-11 LAB — C-REACTIVE PROTEIN: CRP: 0.1 mg/dL — ABNORMAL LOW (ref 0.5–20.0)

## 2015-12-11 NOTE — Patient Instructions (Signed)
Today we updated your med list in our EPIC system...    Continue your current medications the same...  Today we rechecked for CXR & FASTING blood work...    We will contact you w/ the results when available...   As we discussed- we will arrange for an ENT evaluation of your throat, larynx, and swallowing issue...    We may yet have to consider additional GI and/or Neurology evals for completeness...  We gave you the 2016 FLU vaccine today...  Call for any questions.Marland Kitchen..Marland Kitchen

## 2015-12-23 ENCOUNTER — Encounter: Payer: Self-pay | Admitting: Pulmonary Disease
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
## 2015-12-26 ENCOUNTER — Telehealth: Payer: Self-pay | Admitting: Pulmonary Disease

## 2015-12-26 MED ORDER — CARISOPRODOL 350 MG PO TABS: 350.0000 mg | ORAL_TABLET | Freq: Three times a day (TID) | ORAL | Status: DC | PRN

## 2015-12-26 NOTE — Telephone Encounter (Signed)
I called pt>  C/o some musc spasm in back & prev Rx w/ Soma helped... I called in [id number] #50 one po Tid prn musc spasm w/ 2 refills to [facility]... SMN

## 2015-12-26 NOTE — Telephone Encounter (Signed)
Called spoke with pt. He is requesting to have meloxicam called in. He reports he has occas leg cramps and was given this in the past and also feels he may have pulled a muscle in his back. Please advise SN thanks  No Known Allergies   Current Outpatient Prescriptions on File Prior to Visit  Medication Sig Dispense Refill  . isometheptene-acetaminophen-dichloralphenazone (MIDRIN) 65-325-100 MG capsule Take 1 capsule by mouth every 6 (six) hours as needed for migraine. Maximum 5 capsules in 12 hours for migraine headaches, 8 capsules in 24 hours for tension headaches. 30 capsule 1  . pantoprazole (PROTONIX) 40 MG tablet Take 1 tablet (40 mg total) by mouth daily. 30 mintues before dinner (Patient taking differently: Take 40 mg by mouth daily as needed. 30 mintues before dinner) 30 tablet 6   No current facility-administered medications on file prior to visit.

## 2015-12-29 DIAGNOSIS — R490 Dysphonia: Secondary | ICD-10-CM | POA: Diagnosis not present

## 2015-12-29 DIAGNOSIS — R1313 Dysphagia, pharyngeal phase: Secondary | ICD-10-CM | POA: Diagnosis not present

## 2015-12-29 DIAGNOSIS — J387 Other diseases of larynx: Secondary | ICD-10-CM | POA: Diagnosis not present

## 2015-12-29 DIAGNOSIS — R0683 Snoring: Secondary | ICD-10-CM | POA: Diagnosis not present

## 2016-01-14 DIAGNOSIS — Z0100 Encounter for examination of eyes and vision without abnormal findings: Secondary | ICD-10-CM | POA: Diagnosis not present

## 2018-02-28 ENCOUNTER — Other Ambulatory Visit (INDEPENDENT_AMBULATORY_CARE_PROVIDER_SITE_OTHER): Payer: BLUE CROSS/BLUE SHIELD

## 2018-02-28 ENCOUNTER — Ambulatory Visit (INDEPENDENT_AMBULATORY_CARE_PROVIDER_SITE_OTHER)
Admission: RE | Admit: 2018-02-28 | Discharge: 2018-02-28 | Disposition: A | Discharge status: Home / Self Care | Payer: BLUE CROSS/BLUE SHIELD | Source: Ambulatory Visit | Attending: Pulmonary Disease | Admitting: Pulmonary Disease

## 2018-02-28 ENCOUNTER — Encounter: Payer: Self-pay | Admitting: Pulmonary Disease

## 2018-02-28 ENCOUNTER — Ambulatory Visit: Payer: BLUE CROSS/BLUE SHIELD | Admitting: Pulmonary Disease

## 2018-02-28 VITALS — BP 138/84 | HR 89 | Temp 98.4°F | Ht 68.0 in | Wt 171.4 lb

## 2018-02-28 DIAGNOSIS — J301 Allergic rhinitis due to pollen: Secondary | ICD-10-CM

## 2018-02-28 DIAGNOSIS — Z87438 Personal history of other diseases of male genital organs: Secondary | ICD-10-CM | POA: Diagnosis not present

## 2018-02-28 DIAGNOSIS — Z0000 Encounter for general adult medical examination without abnormal findings: Secondary | ICD-10-CM | POA: Diagnosis not present

## 2018-02-28 DIAGNOSIS — E7800 Pure hypercholesterolemia, unspecified: Secondary | ICD-10-CM

## 2018-02-28 DIAGNOSIS — Z8669 Personal history of other diseases of the nervous system and sense organs: Secondary | ICD-10-CM

## 2018-02-28 DIAGNOSIS — R1310 Dysphagia, unspecified: Secondary | ICD-10-CM

## 2018-02-28 LAB — COMPREHENSIVE METABOLIC PANEL
ALBUMIN: 4.4 g/dL (ref 3.5–5.2)
ALK PHOS: 82 U/L (ref 39–117)
ALT: 14 U/L (ref 0–53)
AST: 15 U/L (ref 0–37)
BUN: 14 mg/dL (ref 6–23)
CO2: 30 mEq/L (ref 19–32)
CREATININE: 0.84 mg/dL (ref 0.40–1.50)
Calcium: 9.7 mg/dL (ref 8.4–10.5)
Chloride: 103 mEq/L (ref 96–112)
GFR: 98.96 mL/min (ref >60.00)
GLUCOSE: 110 mg/dL — AB (ref 70–99)
POTASSIUM: 4.5 meq/L (ref 3.5–5.1)
SODIUM: 140 meq/L (ref 135–145)
TOTAL PROTEIN: 7 g/dL (ref 6.0–8.3)
Total Bilirubin: 1.3 mg/dL — ABNORMAL HIGH (ref 0.2–1.2)

## 2018-02-28 LAB — CBC WITH DIFFERENTIAL/PLATELET
BASOS PCT: 0.3 % (ref 0.0–3.0)
Basophils Absolute: 0 10*3/uL (ref 0.0–0.1)
EOS PCT: 1.1 % (ref 0.0–5.0)
Eosinophils Absolute: 0.1 10*3/uL (ref 0.0–0.7)
HCT: 45 % (ref 39.0–52.0)
Hemoglobin: 16 g/dL (ref 13.0–17.0)
Lymphocytes Relative: 8.2 % — ABNORMAL LOW (ref 12.0–46.0)
Lymphs Abs: 1 10*3/uL (ref 0.7–4.0)
MCHC: 35.5 g/dL (ref 30.0–36.0)
MCV: 91.1 fl (ref 78.0–100.0)
MONO ABS: 0.6 10*3/uL (ref 0.1–1.0)
MONOS PCT: 4.8 % (ref 3.0–12.0)
NEUTROS ABS: 10.3 10*3/uL — AB (ref 1.4–7.7)
PLATELETS: 193 10*3/uL (ref 150.0–400.0)
RBC: 4.94 Mil/uL (ref 4.22–5.81)
RDW: 12.7 % (ref 11.5–15.5)
WBC: 12.1 10*3/uL — ABNORMAL HIGH (ref 4.0–10.5)

## 2018-02-28 LAB — LIPID PANEL
CHOLESTEROL: 198 mg/dL (ref 0–200)
HDL: 46.7 mg/dL (ref >39.00)
LDL Cholesterol: 123 mg/dL — ABNORMAL HIGH (ref 0–99)
NONHDL: 151.02
Total CHOL/HDL Ratio: 4
Triglycerides: 142 mg/dL (ref 0.0–149.0)
VLDL: 28.4 mg/dL (ref 0.0–40.0)

## 2018-02-28 LAB — TSH: TSH: 1.8 u[IU]/mL (ref 0.35–4.50)

## 2018-02-28 LAB — PSA: PSA: 1.68 ng/mL (ref 0.10–4.00)

## 2018-02-28 MED ORDER — PANTOPRAZOLE SODIUM 40 MG PO TBEC: 40.0000 mg | DELAYED_RELEASE_TABLET | Freq: Every day | ORAL | 6 refills | Status: DC

## 2018-02-28 MED ORDER — CARISOPRODOL 350 MG PO TABS: 350.0000 mg | ORAL_TABLET | Freq: Three times a day (TID) | ORAL | 2 refills | Status: DC | PRN

## 2018-02-28 NOTE — Patient Instructions (Signed)
Austin Hubbard-- it was great seeing you again!  We refilled your meds as discussed...  Today we did a follow up CXR & FASTING blood work...    We will contact you w/ the results when available...   Keep up the good work w/ diet& exercise...  Call for any questions or if we can be of service in any way.Marland Kitchen..Marland Kitchen
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
## 2018-03-03 LAB — VITAMIN D 1,25 DIHYDROXY
VITAMIN D3 1, 25 (OH): 25 pg/mL
Vitamin D 1, 25 (OH)2 Total: 25 pg/mL (ref 18–72)

## 2018-05-22 ENCOUNTER — Telehealth: Payer: Self-pay | Admitting: Pulmonary Disease

## 2018-05-22 NOTE — Telephone Encounter (Signed)
Called and talked to patient. 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 Attempted to call [employer] at [phone]  since patient stated there were no changes listed on the letter. Got a message on the [employer] phone number that they are experiencing systems issues. Unable to follow up about what regulation changes are being made at this time.  Will route to [place] to follow up on another day.

## 2018-05-24 NOTE — Telephone Encounter (Signed)
Attempted to call [employer] to see what the changes in regulations are, was on hold over 10 minutes, and automated message stated that call time was higher than usual and asked to call back at a later time.  Wcb.

## 2018-05-25 NOTE — Telephone Encounter (Signed)
[facility] and spoke with Molly Maduroobert. The letter sent to Patient was to inform him that his carisoprodol was no longer a preferred medication, after 06/19/18. Currently his co pay is $70.  They are requesting a change in medication.  Medication alternatives for carisoprodol 350mg , that will be approved, are methocarbamol 500mg , tizanidine 2mg  and 6mg , and cyclobenzaprine 10mg .  Called Patient with [employer] explanations about the recent letter he received. I informed him that I would let SN know and that I would follow up with him.  Will follow up with SN

## 2018-05-30 NOTE — Telephone Encounter (Signed)
I called Austin Hubbard about the Soma-- his PMB has placed it on their non-preferred list, alternates include Methocarbamol, Tizanidine, Flexeril...    I checked GoodRx & the generic soma350 #90 is avil for ~$15 & I sent him a prescription 7 the pages/ coupons off of the goodRx site... SMN

## 2018-05-30 NOTE — Telephone Encounter (Signed)
Misty StanleyLisa just a reminder to follow up on this since SN is back this week.

## 2018-05-30 NOTE — Telephone Encounter (Signed)
SN aware and has called Patient.  Nothing further at this time.

## 2019-02-16 ENCOUNTER — Encounter: Payer: Self-pay | Admitting: Gastroenterology

## 2019-02-19 ENCOUNTER — Encounter: Payer: Self-pay | Admitting: Gastroenterology

## 2019-04-02 ENCOUNTER — Other Ambulatory Visit: Payer: Self-pay

## 2019-04-02 ENCOUNTER — Ambulatory Visit (AMBULATORY_SURGERY_CENTER): Payer: BLUE CROSS/BLUE SHIELD | Admitting: *Deleted

## 2019-04-02 VITALS — Ht 67.0 in | Wt 165.0 lb

## 2019-04-02 DIAGNOSIS — Z1211 Encounter for screening for malignant neoplasm of colon: Secondary | ICD-10-CM

## 2019-04-02 MED ORDER — NA SULFATE-K SULFATE-MG SULF 17.5-3.13-1.6 GM/177ML PO SOLN: 1.0000 | Freq: Once | ORAL | 0 refills | Status: AC

## 2019-04-02 NOTE — Progress Notes (Signed)
No egg or soy allergy known to patient  No issues with past sedation with any surgeries  or procedures, no intubation problems  No diet pills per patient No home 02 use per patient  No blood thinners per patient  Pt denies issues with constipation  No A fib or A flutter  EMMI video sent to pt's e mail  Instructions reviewed via phone Informed pt. That instructions will be sent via mail.

## 2019-04-16 ENCOUNTER — Encounter: Payer: BLUE CROSS/BLUE SHIELD | Admitting: Gastroenterology

## 2019-04-26 ENCOUNTER — Encounter: Payer: Self-pay | Admitting: Gastroenterology

## 2019-05-01 ENCOUNTER — Encounter: Payer: BLUE CROSS/BLUE SHIELD | Admitting: Gastroenterology

## 2019-05-08 ENCOUNTER — Telehealth: Payer: Self-pay

## 2019-05-08 NOTE — Telephone Encounter (Signed)
Entered in error

## 2019-05-08 NOTE — Telephone Encounter (Signed)
Covid-19 travel screening questions  Have you traveled in the last 14 days? If yes where? No Do you now or have you had a fever in the last 14 days? No Do you have any respiratory symptoms of shortness of breath or cough now or in the last 14 days? No Do you have any family members or close contacts with diagnosed or suspected Covid-19? No      

## 2019-05-10 ENCOUNTER — Other Ambulatory Visit: Payer: Self-pay

## 2019-05-10 ENCOUNTER — Ambulatory Visit (AMBULATORY_SURGERY_CENTER): Payer: BLUE CROSS/BLUE SHIELD | Admitting: Gastroenterology

## 2019-05-10 ENCOUNTER — Encounter: Payer: Self-pay | Admitting: Gastroenterology

## 2019-05-10 VITALS — BP 124/54 | HR 67 | Temp 98.4°F | Resp 19 | Ht 68.0 in | Wt 171.0 lb

## 2019-05-10 DIAGNOSIS — Z1211 Encounter for screening for malignant neoplasm of colon: Secondary | ICD-10-CM | POA: Diagnosis not present

## 2019-05-10 MED ORDER — SODIUM CHLORIDE 0.9 % IV SOLN: 500.0000 mL | Freq: Once | INTRAVENOUS | Status: DC

## 2019-05-10 NOTE — Op Note (Signed)
[facility] Patient Name: Austin Hubbard Procedure Date: 05/10/2019 8:55 AM MRN: [id number] Endoscopist: Meryl Dare , MD Age: 63 Referring MD:  Date of Birth: 29-Apr-1956 Gender: Male Account #: [id number] Procedure:                Colonoscopy Indications:              Screening for colorectal malignant neoplasm Medicines:                Monitored Anesthesia Care Procedure:                Pre-Anesthesia Assessment:                           - Prior to the procedure, a History and Physical                            was performed, and patient medications and                            allergies were reviewed. The patient's tolerance of                            previous anesthesia was also reviewed. The risks                            and benefits of the procedure and the sedation                            options and risks were discussed with the patient.                            All questions were answered, and informed consent                            was obtained. Prior Anticoagulants: The patient has                            taken no previous anticoagulant or antiplatelet                            agents. ASA Grade Assessment: II - A patient with                            mild systemic disease. After reviewing the risks                            and benefits, the patient was deemed in                            satisfactory condition to undergo the procedure.                           After obtaining informed consent, the colonoscope  was passed under direct vision. Throughout the                            procedure, the patient's blood pressure, pulse, and                            oxygen saturations were monitored continuously. The                            ileocecal valve, appendiceal orifice, and rectum                            were photographed. The quality of the bowel                            preparation was good.  The colonoscopy was performed                            without difficulty. The patient tolerated the                            procedure well. The Colonoscope was introduced                            through the anus and advanced to the the cecum,                            identified by appendiceal orifice and ileocecal                            valve. Scope In: 9:52:18 AM Scope Out: 10:06:01 AM Scope Withdrawal Time: 0 hours 11 minutes 2 seconds  Total Procedure Duration: 0 hours 13 minutes 43 seconds  Findings:                 The perianal and digital rectal examinations were                            normal.                           A few small-mouthed diverticula were found in the                            left colon. There was no evidence of diverticular                            bleeding.                           Internal hemorrhoids were found during                            retroflexion. The hemorrhoids were small and Grade  I (internal hemorrhoids that do not prolapse).                           The exam was otherwise without abnormality on                            direct and retroflexion views. Complications:            No immediate complications. Estimated blood loss:                            None. Estimated Blood Loss:     Estimated blood loss: none. Impression:               - Mild diverticulosis in the left colon.                           - Internal hemorrhoids.                           - The examination was otherwise normal on direct                            and retroflexion views.                           - No specimens collected. Recommendation:           - Repeat colonoscopy in 10 years for screening                            purposes.                           - Patient has a contact number available for                            emergencies. The signs and symptoms of potential                            delayed  complications were discussed with the                            patient. Return to normal activities tomorrow.                            Written discharge instructions were provided to the                            patient.                           - High fiber diet.                           - Continue present medications. Meryl DareMalcolm T Lailie Smead, MD 05/10/2019 10:10:10 AM This report has been signed electronically.

## 2019-05-10 NOTE — Patient Instructions (Addendum)
YOU HAD AN ENDOSCOPIC PROCEDURE TODAY AT THE [facility]:   Refer to the procedure report that was given to you for any specific questions about what was found during the examination.  If the procedure report does not answer your questions, please call your gastroenterologist to clarify.  If you requested that your care partner not be given the details of your procedure findings, then the procedure report has been included in a sealed envelope for you to review at your convenience later.  YOU SHOULD EXPECT: Some feelings of bloating in the abdomen. Passage of more gas than usual.  Walking can help get rid of the air that was put into your GI tract during the procedure and reduce the bloating. If you had a lower endoscopy (such as a colonoscopy or flexible sigmoidoscopy) you may notice spotting of blood in your stool or on the toilet paper. If you underwent a bowel prep for your procedure, you may not have a normal bowel movement for a few days.  Please Note:  You might notice some irritation and congestion in your nose or some drainage.  This is from the oxygen used during your procedure.  There is no need for concern and it should clear up in a day or so.  SYMPTOMS TO REPORT IMMEDIATELY:   Following lower endoscopy (colonoscopy or flexible sigmoidoscopy):  Excessive amounts of blood in the stool  Significant tenderness or worsening of abdominal pains  Swelling of the abdomen that is new, acute  Fever of 100F or higher  Please see handouts on diverticulosis, and hemorrhoids.  For urgent or emergent issues, a gastroenterologist can be reached at any hour by calling [phone].   DIET:  We do recommend a small meal at first, but then you may proceed to your regular diet.  Drink plenty of fluids but you should avoid alcoholic beverages for 24 hours.  ACTIVITY:  You should plan to take it easy for the rest of today and you should NOT DRIVE or use heavy machinery until tomorrow  (because of the sedation medicines used during the test).    FOLLOW UP: Our staff will call the number listed on your records 48-72 hours following your procedure to check on you and address any questions or concerns that you may have regarding the information given to you following your procedure. If we do not reach you, we will leave a message.  We will attempt to reach you two times.  During this call, we will ask if you have developed any symptoms of COVID 19. If you develop any symptoms (for example fever, flu-like symptoms, shortness of breath, cough etc.) before then, please call [phone].  If any biopsies were taken you will be contacted by phone or by letter within the next 1-3 weeks.  Please call us at [phone] if you have not heard about the biopsies in 3 weeks.    SIGNATURES/CONFIDENTIALITY: You and/or your care partner have signed paperwork which will be entered into your electronic medical record.  These signatures attest to the fact that that the information above on your After Visit Summary has been reviewed and is understood.  Full responsibility of the confidentiality of this discharge information lies with you and/or your care-partner.   Thank you for allowing [place] to provide your healthcare today.

## 2019-05-10 NOTE — Progress Notes (Signed)
covid screen and temp per Courtney Washington 

## 2019-05-10 NOTE — Progress Notes (Signed)
Report given to PACU, vss 

## 2019-05-10 NOTE — Progress Notes (Signed)
Pt's states no medical or surgical changes since previsit or office visit.Pt's states no medical or surgical changes since previsit or office visit. 

## 2019-05-12 ENCOUNTER — Telehealth: Payer: Self-pay | Admitting: *Deleted

## 2019-05-12 NOTE — Telephone Encounter (Signed)
1. Have you developed a fever since your procedure? no  2.   Have you had an respiratory symptoms (SOB or cough) since your procedure? no  3.   Have you tested positive for COVID 19 since your procedure no  4.   Have you had any family members/close contacts diagnosed with the COVID 19 since your procedure?  no   If any of these questions are a yes, please inquire if patient has been seen by family doctor and route this note to Laverna Peace, Charity fundraiser.    Follow up Call-  Call back number 05/10/2019  Post procedure Call Back phone  # [phone]  Permission to leave phone message Yes  Some recent data might be hidden     Patient questions:  Do you have a fever, pain , or abdominal swelling? No. Pain Score  0 *  Have you tolerated food without any problems? Yes.    Have you been able to return to your normal activities? Yes.    Do you have any questions about your discharge instructions: Diet   No. Medications  No. Follow up visit  No.  Do you have questions or concerns about your Care? No.  Actions: * If pain score is 4 or above: No action needed, pain <4.

## 2019-06-03 IMAGING — DX DG CHEST 2V
2 series · 2 of 2 positions shown · non-contrast
Comparison: 12/11/2015

CLINICAL DATA: Annual physical

EXAM:
CHEST - 2 VIEW

[chest pa]
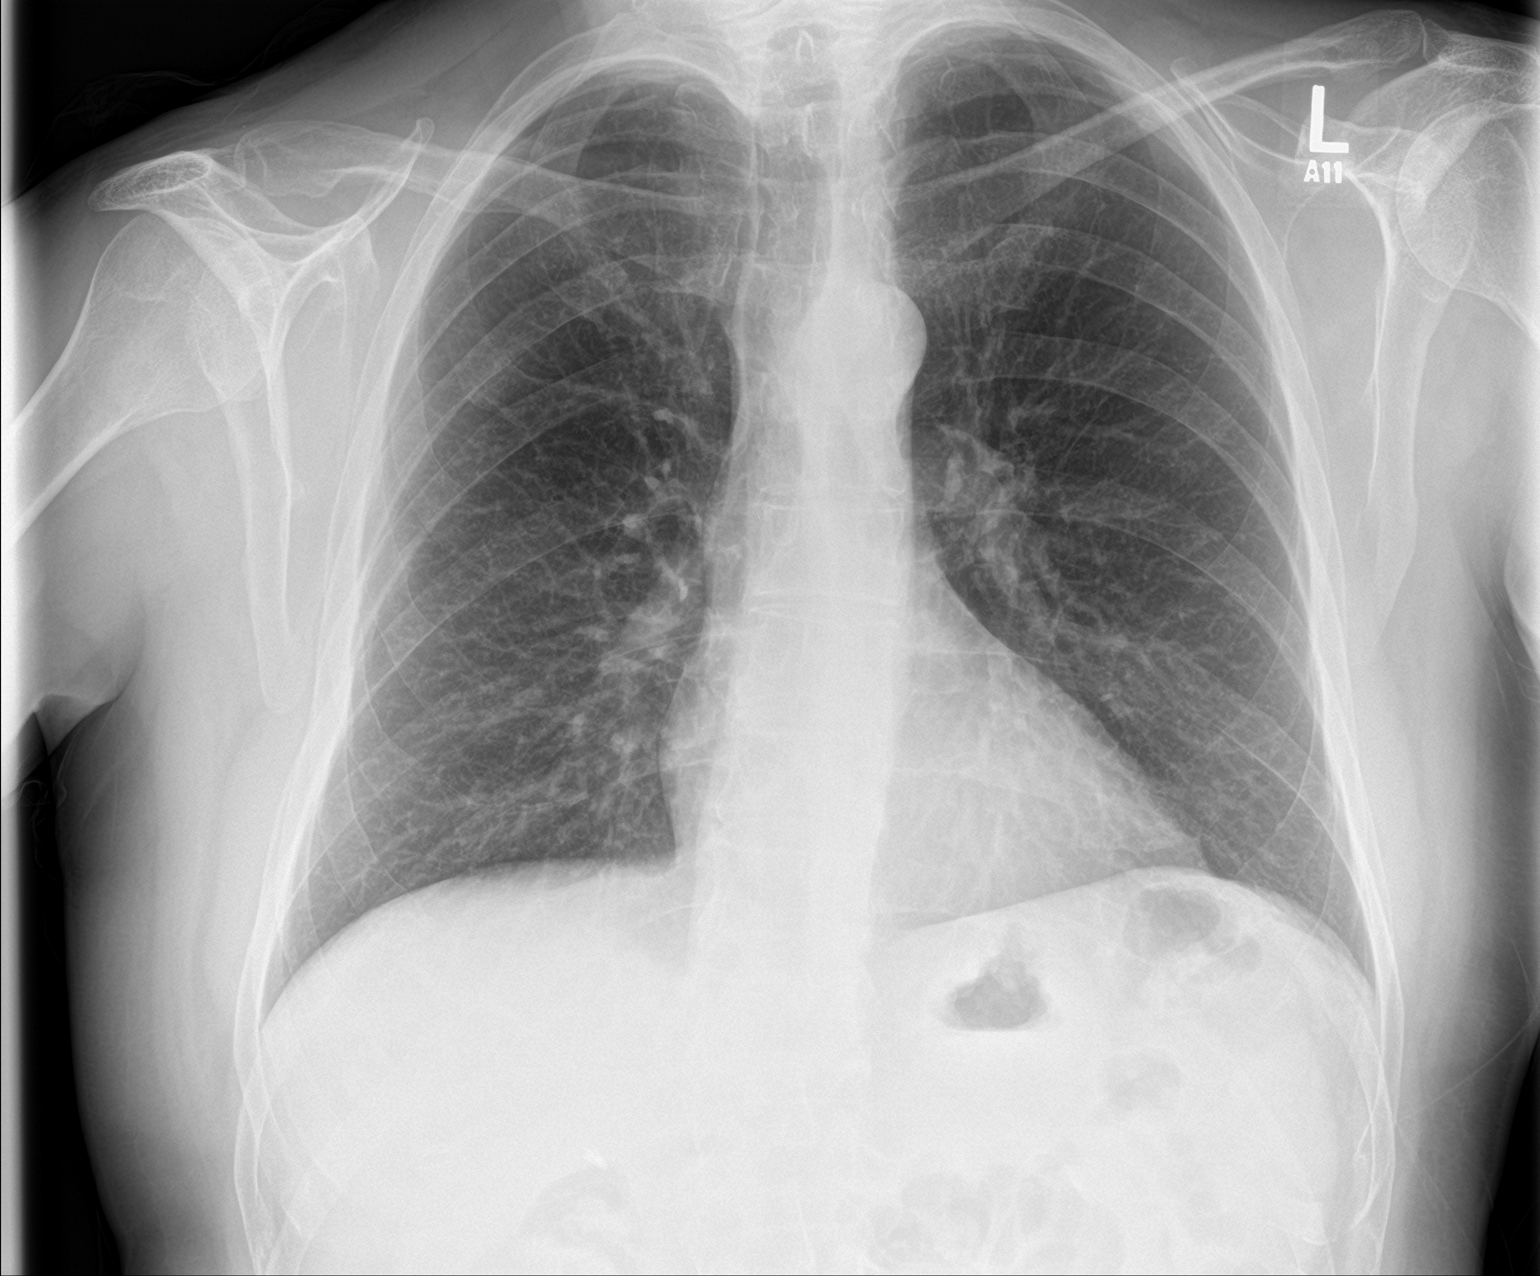

[chest lat]
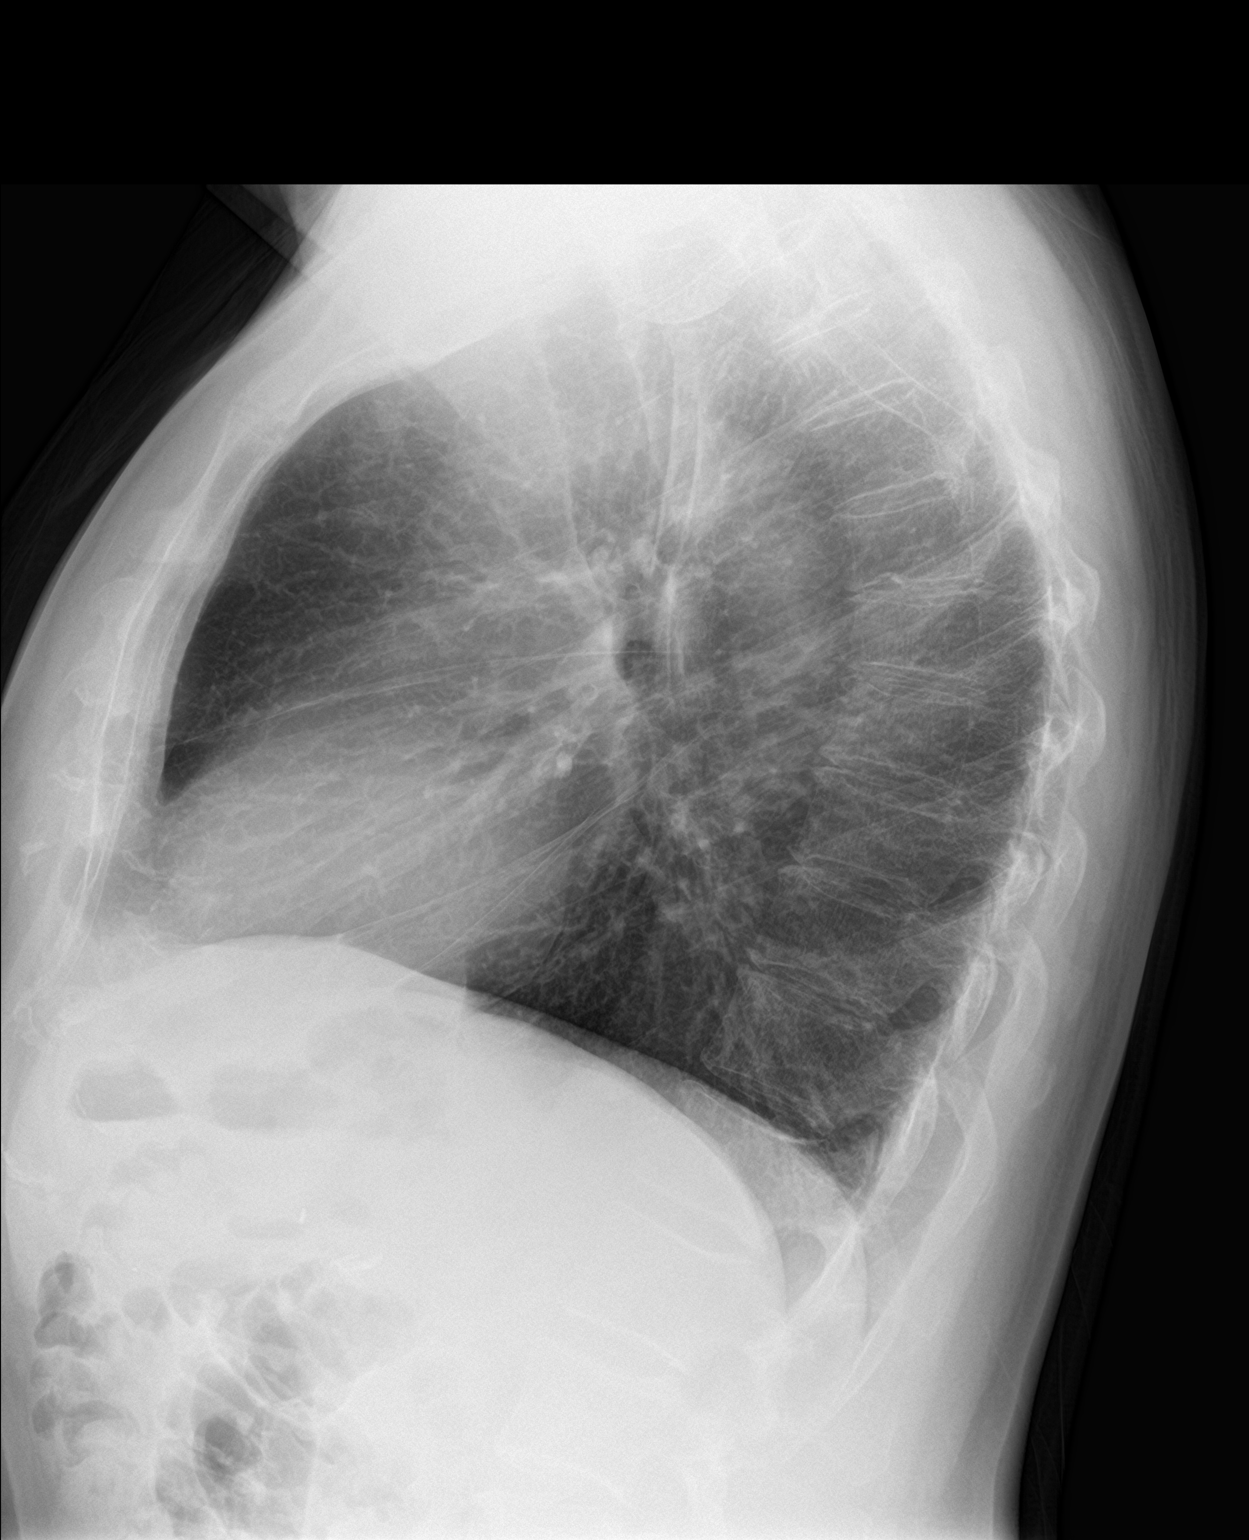

[2 of 2 positions shown; findings below may reference images not displayed]

FINDINGS: The heart size and mediastinal contours are within normal limits.
Both lungs are clear. The visualized skeletal structures are
unremarkable.
IMPRESSION: No active cardiopulmonary disease.

## 2020-02-08 ENCOUNTER — Ambulatory Visit: Payer: BC Managed Care – PPO | Attending: Internal Medicine

## 2020-02-08 DIAGNOSIS — Z23 Encounter for immunization: Secondary | ICD-10-CM

## 2020-02-08 NOTE — Progress Notes (Signed)
   Covid-19 Vaccination Clinic  Name:  Austin Hubbard    MRN: [id number] DOB: 06/05/1956  02/08/2020  Mr. Colquhoun was observed post Covid-19 immunization for 15 minutes without incidence. He was provided with Vaccine Information Sheet and instruction to access the V-Safe system.   Mr. Wearing was instructed to call 911 with any severe reactions post vaccine: Marland Kitchen Difficulty breathing  . Swelling of your face and throat  . A fast heartbeat  . A bad rash all over your body  . Dizziness and weakness    Immunizations Administered    Name Date Dose VIS Date Route   Pfizer COVID-19 Vaccine 02/08/2020  3:23 PM 0.3 mL 11/30/2019 Intramuscular   Manufacturer: [employer], [employer]   Lot: [id number]   NDC: [address]-8

## 2020-03-04 ENCOUNTER — Ambulatory Visit: Payer: BC Managed Care – PPO | Attending: Internal Medicine

## 2020-03-04 DIAGNOSIS — Z23 Encounter for immunization: Secondary | ICD-10-CM

## 2020-03-04 NOTE — Progress Notes (Signed)
   Covid-19 Vaccination Clinic  Name:  Austin Hubbard    MRN: [id number] DOB: Jun 30, 1956  03/04/2020  Austin Hubbard was observed post Covid-19 immunization for 15 minutes without incident. He was provided with Vaccine Information Sheet and instruction to access the V-Safe system.   Austin Hubbard was instructed to call 911 with any severe reactions post vaccine: Marland Kitchen Difficulty breathing  . Swelling of face and throat  . A fast heartbeat  . A bad rash all over body  . Dizziness and weakness   Immunizations Administered    Name Date Dose VIS Date Route   Pfizer COVID-19 Vaccine 03/04/2020  8:21 AM 0.3 mL 11/30/2019 Intramuscular   Manufacturer: [employer], [employer]   Lot: [id number]   NDC: [address]-0

## 2021-10-02 ENCOUNTER — Other Ambulatory Visit (HOSPITAL_BASED_OUTPATIENT_CLINIC_OR_DEPARTMENT_OTHER): Payer: Self-pay

## 2021-10-02 ENCOUNTER — Ambulatory Visit: Payer: Self-pay | Attending: Internal Medicine

## 2021-10-02 DIAGNOSIS — Z23 Encounter for immunization: Secondary | ICD-10-CM

## 2021-10-02 MED ORDER — INFLUENZA VAC SPLIT QUAD 0.5 ML IM SUSY
PREFILLED_SYRINGE | INTRAMUSCULAR | 0 refills | Status: DC
  Filled 2021-10-02: qty 0.5, 1d supply, fill #0

## 2021-10-02 NOTE — Progress Notes (Signed)
   Covid-19 Vaccination Clinic  Name:  ELMOR KOST    MRN: [id number] DOB: 01-02-1956  10/02/2021  Mr. Frerking was observed post Covid-19 immunization for 15 minutes without incident. He was provided with Vaccine Information Sheet and instruction to access the V-Safe system.   Mr. Raineri was instructed to call 911 with any severe reactions post vaccine: Difficulty breathing  Swelling of face and throat  A fast heartbeat  A bad rash all over body  Dizziness and weakness

## 2021-10-23 ENCOUNTER — Other Ambulatory Visit (HOSPITAL_BASED_OUTPATIENT_CLINIC_OR_DEPARTMENT_OTHER): Payer: Self-pay

## 2021-10-23 MED ORDER — PFIZER COVID-19 VAC BIVALENT 30 MCG/0.3ML IM SUSP
INTRAMUSCULAR | 0 refills | Status: DC
  Filled 2021-10-23: qty 0.3, 1d supply, fill #0

## 2023-01-18 ENCOUNTER — Institutional Professional Consult (permissible substitution): Payer: Self-pay | Admitting: Pulmonary Disease

## 2023-04-04 ENCOUNTER — Other Ambulatory Visit: Payer: Self-pay | Admitting: Emergency Medicine

## 2023-04-04 ENCOUNTER — Ambulatory Visit: Payer: BC Managed Care – PPO | Admitting: Emergency Medicine

## 2023-04-04 ENCOUNTER — Encounter: Payer: Self-pay | Admitting: Emergency Medicine

## 2023-04-04 VITALS — BP 160/90 | HR 70 | Temp 98.5°F | Ht 68.0 in | Wt 177.5 lb

## 2023-04-04 DIAGNOSIS — E785 Hyperlipidemia, unspecified: Secondary | ICD-10-CM

## 2023-04-04 DIAGNOSIS — Z0000 Encounter for general adult medical examination without abnormal findings: Secondary | ICD-10-CM

## 2023-04-04 DIAGNOSIS — Z1329 Encounter for screening for other suspected endocrine disorder: Secondary | ICD-10-CM | POA: Diagnosis not present

## 2023-04-04 DIAGNOSIS — Z13220 Encounter for screening for lipoid disorders: Secondary | ICD-10-CM | POA: Diagnosis not present

## 2023-04-04 DIAGNOSIS — Z13228 Encounter for screening for other metabolic disorders: Secondary | ICD-10-CM | POA: Diagnosis not present

## 2023-04-04 DIAGNOSIS — Z130 Encounter for screening for diseases of the blood and blood-forming organs and certain disorders involving the immune mechanism: Secondary | ICD-10-CM

## 2023-04-04 DIAGNOSIS — Z7689 Persons encountering health services in other specified circumstances: Secondary | ICD-10-CM

## 2023-04-04 LAB — CBC WITH DIFFERENTIAL/PLATELET
Basophils Absolute: 0 10*3/uL (ref 0.0–0.1)
Basophils Relative: 0.6 % (ref 0.0–3.0)
Eosinophils Absolute: 0.1 10*3/uL (ref 0.0–0.7)
Eosinophils Relative: 2.1 % (ref 0.0–5.0)
HCT: 42.9 % (ref 39.0–52.0)
Hemoglobin: 15.2 g/dL (ref 13.0–17.0)
Lymphocytes Relative: 18.2 % (ref 12.0–46.0)
Lymphs Abs: 1.1 10*3/uL (ref 0.7–4.0)
MCHC: 35.5 g/dL (ref 30.0–36.0)
MCV: 92.8 fl (ref 78.0–100.0)
Monocytes Absolute: 0.5 10*3/uL (ref 0.1–1.0)
Monocytes Relative: 7.7 % (ref 3.0–12.0)
Neutro Abs: 4.2 10*3/uL (ref 1.4–7.7)
Neutrophils Relative %: 71.4 % (ref 43.0–77.0)
Platelets: 181 10*3/uL (ref 150.0–400.0)
RBC: 4.62 Mil/uL (ref 4.22–5.81)
RDW: 13 % (ref 11.5–15.5)
WBC: 5.9 10*3/uL (ref 4.0–10.5)

## 2023-04-04 LAB — COMPREHENSIVE METABOLIC PANEL
ALT: 13 U/L (ref 0–53)
AST: 19 U/L (ref 0–37)
Albumin: 4.4 g/dL (ref 3.5–5.2)
Alkaline Phosphatase: 87 U/L (ref 39–117)
BUN: 14 mg/dL (ref 6–23)
CO2: 28 mEq/L (ref 19–32)
Calcium: 9.2 mg/dL (ref 8.4–10.5)
Chloride: 104 mEq/L (ref 96–112)
Creatinine, Ser: 0.76 mg/dL (ref 0.40–1.50)
GFR: 94.38 mL/min (ref >60.00)
Glucose, Bld: 105 mg/dL — ABNORMAL HIGH (ref 70–99)
Potassium: 4.2 mEq/L (ref 3.5–5.1)
Sodium: 141 mEq/L (ref 135–145)
Total Bilirubin: 1.5 mg/dL — ABNORMAL HIGH (ref 0.2–1.2)
Total Protein: 6.7 g/dL (ref 6.0–8.3)

## 2023-04-04 LAB — HEMOGLOBIN A1C: Hgb A1c MFr Bld: 5.2 % (ref 4.6–6.5)

## 2023-04-04 LAB — PSA: PSA: 1.29 ng/mL (ref 0.10–4.00)

## 2023-04-04 LAB — LIPID PANEL
Cholesterol: 219 mg/dL — ABNORMAL HIGH (ref 0–200)
HDL: 53.2 mg/dL (ref >39.00)
LDL Cholesterol: 153 mg/dL — ABNORMAL HIGH (ref 0–99)
NonHDL: 165.69
Total CHOL/HDL Ratio: 4
Triglycerides: 64 mg/dL (ref 0.0–149.0)
VLDL: 12.8 mg/dL (ref 0.0–40.0)

## 2023-04-04 MED ORDER — ROSUVASTATIN CALCIUM 10 MG PO TABS: 10.0000 mg | ORAL_TABLET | Freq: Every day | ORAL | 3 refills | Status: DC
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
## 2023-04-04 NOTE — Patient Instructions (Signed)
Health Maintenance, Male Adopting a healthy lifestyle and getting preventive care are important in promoting health and wellness. Ask your health care provider about: The right schedule for you to have regular tests and exams. Things you can do on your own to prevent diseases and keep yourself healthy. What should I know about diet, weight, and exercise? Eat a healthy diet  Eat a diet that includes plenty of vegetables, fruits, low-fat dairy products, and lean protein. Do not eat a lot of foods that are high in solid fats, added sugars, or sodium. Maintain a healthy weight Body mass index (BMI) is a measurement that can be used to identify possible weight problems. It estimates body fat based on height and weight. Your health care provider can help determine your BMI and help you achieve or maintain a healthy weight. Get regular exercise Get regular exercise. This is one of the most important things you can do for your health. Most adults should: Exercise for at least 150 minutes each week. The exercise should increase your heart rate and make you sweat (moderate-intensity exercise). Do strengthening exercises at least twice a week. This is in addition to the moderate-intensity exercise. Spend less time sitting. Even light physical activity can be beneficial. Watch cholesterol and blood lipids Have your blood tested for lipids and cholesterol at 66 years of age, then have this test every 5 years. You may need to have your cholesterol levels checked more often if: Your lipid or cholesterol levels are high. You are older than 66 years of age. You are at high risk for heart disease. What should I know about cancer screening? Many types of cancers can be detected early and may often be prevented. Depending on your health history and family history, you may need to have cancer screening at various ages. This may include screening for: Colorectal cancer. Prostate cancer. Skin cancer. Lung  cancer. What should I know about heart disease, diabetes, and high blood pressure? Blood pressure and heart disease High blood pressure causes heart disease and increases the risk of stroke. This is more likely to develop in people who have high blood pressure readings or are overweight. Talk with your health care provider about your target blood pressure readings. Have your blood pressure checked: Every 3-5 years if you are 18-39 years of age. Every year if you are 40 years old or older. If you are between the ages of 65 and 75 and are a current or former smoker, ask your health care provider if you should have a one-time screening for abdominal aortic aneurysm (AAA). Diabetes Have regular diabetes screenings. This checks your fasting blood sugar level. Have the screening done: Once every three years after age 45 if you are at a normal weight and have a low risk for diabetes. More often and at a younger age if you are overweight or have a high risk for diabetes. What should I know about preventing infection? Hepatitis B If you have a higher risk for hepatitis B, you should be screened for this virus. Talk with your health care provider to find out if you are at risk for hepatitis B infection. Hepatitis C Blood testing is recommended for: Everyone born from 1945 through 1965. Anyone with known risk factors for hepatitis C. Sexually transmitted infections (STIs) You should be screened each year for STIs, including gonorrhea and chlamydia, if: You are sexually active and are younger than 66 years of age. You are older than 66 years of age and your   health care provider tells you that you are at risk for this type of infection. Your sexual activity has changed since you were last screened, and you are at increased risk for chlamydia or gonorrhea. Ask your health care provider if you are at risk. Ask your health care provider about whether you are at high risk for HIV. Your health care provider  may recommend a prescription medicine to help prevent HIV infection. If you choose to take medicine to prevent HIV, you should first get tested for HIV. You should then be tested every 3 months for as long as you are taking the medicine. Follow these instructions at home: Alcohol use Do not drink alcohol if your health care provider tells you not to drink. If you drink alcohol: Limit how much you have to 0-2 drinks a day. Know how much alcohol is in your drink. In the [place] one drink equals one 12 oz bottle of beer (355 mL), one 5 oz glass of wine (148 mL), or one 1 oz glass of hard liquor (44 mL). Lifestyle Do not use any products that contain nicotine or tobacco. These products include cigarettes, chewing tobacco, and vaping devices, such as e-cigarettes. If you need help quitting, ask your health care provider. Do not use street drugs. Do not share needles. Ask your health care provider for help if you need support or information about quitting drugs. General instructions Schedule regular health, dental, and eye exams. Stay current with your vaccines. Tell your health care provider if: You often feel depressed. You have ever been abused or do not feel safe at home. Summary Adopting a healthy lifestyle and getting preventive care are important in promoting health and wellness. Follow your health care provider's instructions about healthy diet, exercising, and getting tested or screened for diseases. Follow your health care provider's instructions on monitoring your cholesterol and blood pressure. This information is not intended to replace advice given to you by your health care provider. Make sure you discuss any questions you have with your health care provider. Document Revised: 04/27/2021 Document Reviewed: 04/27/2021 Elsevier Patient Education  2023 [employer].  

## 2023-04-05 LAB — HEPATITIS C ANTIBODY: Hepatitis C Ab: NONREACTIVE

## 2023-04-05 LAB — HIV ANTIBODY (ROUTINE TESTING W REFLEX): HIV 1&2 Ab, 4th Generation: NONREACTIVE

## 2024-01-23 ENCOUNTER — Ambulatory Visit: Payer: Self-pay | Admitting: Emergency Medicine

## 2024-01-23 ENCOUNTER — Encounter: Payer: Self-pay | Admitting: Emergency Medicine

## 2024-01-23 ENCOUNTER — Ambulatory Visit (INDEPENDENT_AMBULATORY_CARE_PROVIDER_SITE_OTHER): Payer: 59 | Admitting: Emergency Medicine

## 2024-01-23 VITALS — BP 158/88 | HR 80 | Temp 98.3°F | Ht 68.0 in | Wt 178.0 lb

## 2024-01-23 DIAGNOSIS — Z23 Encounter for immunization: Secondary | ICD-10-CM | POA: Diagnosis not present

## 2024-01-23 DIAGNOSIS — R319 Hematuria, unspecified: Secondary | ICD-10-CM | POA: Insufficient documentation

## 2024-01-23 LAB — URINALYSIS
Bilirubin Urine: NEGATIVE
Hgb urine dipstick: NEGATIVE
Ketones, ur: NEGATIVE
Leukocytes,Ua: NEGATIVE
Nitrite: NEGATIVE
Specific Gravity, Urine: 1.02 (ref 1.000–1.030)
Total Protein, Urine: NEGATIVE
Urine Glucose: NEGATIVE
Urobilinogen, UA: 1 (ref 0.0–1.0)
pH: 6.5 (ref 5.0–8.0)

## 2024-01-23 LAB — CBC WITH DIFFERENTIAL/PLATELET
Basophils Absolute: 0 10*3/uL (ref 0.0–0.1)
Basophils Relative: 0.6 % (ref 0.0–3.0)
Eosinophils Absolute: 0.1 10*3/uL (ref 0.0–0.7)
Eosinophils Relative: 1.5 % (ref 0.0–5.0)
HCT: 44.2 % (ref 39.0–52.0)
Hemoglobin: 15.3 g/dL (ref 13.0–17.0)
Lymphocytes Relative: 17 % (ref 12.0–46.0)
Lymphs Abs: 1.1 10*3/uL (ref 0.7–4.0)
MCHC: 34.6 g/dL (ref 30.0–36.0)
MCV: 93.7 fL (ref 78.0–100.0)
Monocytes Absolute: 0.5 10*3/uL (ref 0.1–1.0)
Monocytes Relative: 7.7 % (ref 3.0–12.0)
Neutro Abs: 4.6 10*3/uL (ref 1.4–7.7)
Neutrophils Relative %: 73.2 % (ref 43.0–77.0)
Platelets: 174 10*3/uL (ref 150.0–400.0)
RBC: 4.72 Mil/uL (ref 4.22–5.81)
RDW: 12.2 % (ref 11.5–15.5)
WBC: 6.2 10*3/uL (ref 4.0–10.5)

## 2024-01-23 LAB — COMPREHENSIVE METABOLIC PANEL
ALT: 15 U/L (ref 0–53)
AST: 19 U/L (ref 0–37)
Albumin: 4.3 g/dL (ref 3.5–5.2)
Alkaline Phosphatase: 75 U/L (ref 39–117)
BUN: 20 mg/dL (ref 6–23)
CO2: 27 meq/L (ref 19–32)
Calcium: 8.7 mg/dL (ref 8.4–10.5)
Chloride: 105 meq/L (ref 96–112)
Creatinine, Ser: 0.74 mg/dL (ref 0.40–1.50)
GFR: 94.61 mL/min (ref >60.00)
Glucose, Bld: 116 mg/dL — ABNORMAL HIGH (ref 70–99)
Potassium: 4.2 meq/L (ref 3.5–5.1)
Sodium: 140 meq/L (ref 135–145)
Total Bilirubin: 1.3 mg/dL — ABNORMAL HIGH (ref 0.2–1.2)
Total Protein: 6.7 g/dL (ref 6.0–8.3)

## 2024-01-23 LAB — PSA: PSA: 1.26 ng/mL (ref 0.10–4.00)

## 2024-01-23 NOTE — Patient Instructions (Signed)
 Health Maintenance After Age 67 After age 41, you are at a higher risk for certain long-term diseases and infections as well as injuries from falls. Falls are a major cause of broken bones and head injuries in people who are older than age 26. Getting regular preventive care can help to keep you healthy and well. Preventive care includes getting regular testing and making lifestyle changes as recommended by your health care provider. Talk with your health care provider about: Which screenings and tests you should have. A screening is a test that checks for a disease when you have no symptoms. A diet and exercise plan that is right for you. What should I know about screenings and tests to prevent falls? Screening and testing are the best ways to find a health problem early. Early diagnosis and treatment give you the best chance of managing medical conditions that are common after age 48. Certain conditions and lifestyle choices may make you more likely to have a fall. Your health care provider may recommend: Regular vision checks. Poor vision and conditions such as cataracts can make you more likely to have a fall. If you wear glasses, make sure to get your prescription updated if your vision changes. Medicine review. Work with your health care provider to regularly review all of the medicines you are taking, including over-the-counter medicines. Ask your health care provider about any side effects that may make you more likely to have a fall. Tell your health care provider if any medicines that you take make you feel dizzy or sleepy. Strength and balance checks. Your health care provider may recommend certain tests to check your strength and balance while standing, walking, or changing positions. Foot health exam. Foot pain and numbness, as well as not wearing proper footwear, can make you more likely to have a fall. Screenings, including: Osteoporosis screening. Osteoporosis is a condition that causes  the bones to get weaker and break more easily. Blood pressure screening. Blood pressure changes and medicines to control blood pressure can make you feel dizzy. Depression screening. You may be more likely to have a fall if you have a fear of falling, feel depressed, or feel unable to do activities that you used to do. Alcohol use screening. Using too much alcohol can affect your balance and may make you more likely to have a fall. Follow these instructions at home: Lifestyle Do not drink alcohol if: Your health care provider tells you not to drink. If you drink alcohol: Limit how much you have to: 0-1 drink a day for women. 0-2 drinks a day for men. Know how much alcohol is in your drink. In the [place] one drink equals one 12 oz bottle of beer (355 mL), one 5 oz glass of wine (148 mL), or one 1 oz glass of hard liquor (44 mL). Do not use any products that contain nicotine or tobacco. These products include cigarettes, chewing tobacco, and vaping devices, such as e-cigarettes. If you need help quitting, ask your health care provider. Activity  Follow a regular exercise program to stay fit. This will help you maintain your balance. Ask your health care provider what types of exercise are appropriate for you. If you need a cane or walker, use it as recommended by your health care provider. Wear supportive shoes that have nonskid soles. Safety  Remove any tripping hazards, such as rugs, cords, and clutter. Install safety equipment such as grab bars in bathrooms and safety rails on stairs. Keep rooms and walkways  well-lit. General instructions Talk with your health care provider about your risks for falling. Tell your health care provider if: You fall. Be sure to tell your health care provider about all falls, even ones that seem minor. You feel dizzy, tiredness (fatigue), or off-balance. Take over-the-counter and prescription medicines only as told by your health care provider. These include  supplements. Eat a healthy diet and maintain a healthy weight. A healthy diet includes low-fat dairy products, low-fat (lean) meats, and fiber from whole grains, beans, and lots of fruits and vegetables. Stay current with your vaccines. Schedule regular health, dental, and eye exams. Summary Having a healthy lifestyle and getting preventive care can help to protect your health and wellness after age 24. Screening and testing are the best way to find a health problem early and help you avoid having a fall. Early diagnosis and treatment give you the best chance for managing medical conditions that are more common for people who are older than age 81. Falls are a major cause of broken bones and head injuries in people who are older than age 75. Take precautions to prevent a fall at home. Work with your health care provider to learn what changes you can make to improve your health and wellness and to prevent falls. This information is not intended to replace advice given to you by your health care provider. Make sure you discuss any questions you have with your health care provider. Document Revised: 04/27/2021 Document Reviewed: 04/27/2021 Elsevier Patient Education  2024 [employer].

## 2024-01-23 NOTE — Assessment & Plan Note (Addendum)
Clinically stable.  Noticed spot of blood in underwear this morning. Most likely coming from lower urinary tract system. Non-smoker.  No family history of prostate or bladder cancer Differential diagnosis including possibility of cancer discussed Needs urology evaluation. Referral placed today. Recommend urinalysis and urine culture along with blood work History of prostatitis
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
## 2024-01-23 NOTE — Telephone Encounter (Signed)
Chief Complaint: Penile Bleeding Symptoms: Bleeding from the tip of the penis  Frequency: 2 episodes  Pertinent Negatives: Patient denies pain, urinary symptoms,  Disposition: [] ED /[] Urgent Care (no appt availability in office) / [x] Appointment(In office/virtual)/ []  [facility]/ [] Home Care/ [] Refused Recommended Disposition /[]  Mobile Bus/ []  Follow-up with PCP Additional Notes: Patient states he had some bleeding from the penis over night. He stated he notice the bleeding about 1 week ago but it was small drops of blood here and there. But last night patient states it was a significant amount of blood coming from the penis. Patient denies pain or blood in the urine. Patient states he has a history of prostate issues and not sure if this is related. Patient states this is a new symptoms for him. Care advice was given and patient has been scheduled to be evaluated by PCP today.   Copied from CRM [id number]. Topic: Clinical - Red Word Triage >> Jan 23, 2024  9:03 AM Irine Seal wrote: [facility] that prompted transfer to Nurse Triage: patient woke up with a significant amount of blood in shorts, sudden onset of bleeding from  penis area, patient has history of prostate issues. He stated prior to this he has felt generally rundown but thought it was from his busy schedule. Stated that the only other symptoms before today were a little drop of blood in his underwear. He is wanting to schedule an appt with PCP Reason for Disposition  Pus (white, yellow) or bloody discharge from end of penis  Answer Assessment - Initial Assessment Questions 1. SYMPTOM: "What's the main symptom you're concerned about?" (e.g., discharge from penis, rash, pain, itching, swelling)     Bleeding  2. LOCATION: "Where is the bleeding located?"     From the Penis  3. ONSET: "When did bleeding  start?"     1 week ago  4. PAIN: "Is there any pain?" If Yes, ask: "How bad is it?"  (Scale 1-10; or mild, moderate,  severe)     No  5. URINE: "Any difficulty passing urine?" If Yes, ask: "When was the last time?"     No  6. CAUSE: "What do you think is causing the symptoms?"     I'm not sure hx prostate issues in the past  7. OTHER SYMPTOMS: "Do you have any other symptoms?" (e.g., fever, abdomen pain, blood in urine)     Fatigue  Protocols used: Penis and Scrotum Symptoms-A-AH

## 2024-01-25 LAB — URINE CULTURE: Result:: NO GROWTH

## 2024-03-02 ENCOUNTER — Other Ambulatory Visit (HOSPITAL_COMMUNITY): Payer: Self-pay

## 2024-05-23 ENCOUNTER — Other Ambulatory Visit: Payer: Self-pay | Admitting: Emergency Medicine

## 2024-05-23 DIAGNOSIS — E785 Hyperlipidemia, unspecified: Secondary | ICD-10-CM

## 2024-07-11 ENCOUNTER — Other Ambulatory Visit: Payer: Self-pay | Admitting: Emergency Medicine

## 2024-07-11 DIAGNOSIS — E785 Hyperlipidemia, unspecified: Secondary | ICD-10-CM

## 2024-07-11 NOTE — Telephone Encounter (Unsigned)
 Copied from CRM [id number]. Topic: Clinical - Medication Refill >> Jul 11, 2024  9:48 AM Jayma L wrote: Medication: rosuvastatin  (CRESTOR ) 10 MG tablet  Has the patient contacted their pharmacy? Yes (Agent: If no, request that the patient contact the pharmacy for the refill. If patient does not wish to contact the pharmacy document the reason why and proceed with request.) (Agent: If yes, when and what did the pharmacy advise?)  This is the patient's preferred pharmacy:  [id number] - [place], [place] - [address] [place] [address] Phone: [phone] Fax: [phone]  Is this the correct pharmacy for this prescription? Yes If no, delete pharmacy and type the correct one.   Has the prescription been filled recently? No  Is the patient out of the medication? No  Has the patient been seen for an appointment in the last year OR does the patient have an upcoming appointment? Yes  Can we respond through MyChart? Yes  Agent: Please be advised that Rx refills may take up to 3 business days. We ask that you follow-up with your pharmacy.

## 2024-07-13 MED ORDER — ROSUVASTATIN CALCIUM 10 MG PO TABS: 10.0000 mg | ORAL_TABLET | Freq: Every day | ORAL | 3 refills | Status: AC
